# Patient Record
Sex: Male | Born: 1950 | Hispanic: Yes | Marital: Married | Smoking: Current every day smoker
Health system: Southern US, Community
[De-identification: ages and names within clinical notes are randomized; demographics above are authoritative.]

## PROBLEM LIST (undated history)

## (undated) DIAGNOSIS — H4902 Third [oculomotor] nerve palsy, left eye: Secondary | ICD-10-CM

## (undated) DIAGNOSIS — H532 Diplopia: Secondary | ICD-10-CM

## (undated) DIAGNOSIS — M75120 Complete rotator cuff tear or rupture of unspecified shoulder, not specified as traumatic: Secondary | ICD-10-CM

## (undated) DIAGNOSIS — R911 Solitary pulmonary nodule: Secondary | ICD-10-CM

## (undated) DIAGNOSIS — Z87442 Personal history of urinary calculi: Secondary | ICD-10-CM

## (undated) DIAGNOSIS — S52509A Unspecified fracture of the lower end of unspecified radius, initial encounter for closed fracture: Secondary | ICD-10-CM

## (undated) DIAGNOSIS — G9389 Other specified disorders of brain: Secondary | ICD-10-CM

## (undated) DIAGNOSIS — E785 Hyperlipidemia, unspecified: Secondary | ICD-10-CM

## (undated) DIAGNOSIS — I10 Essential (primary) hypertension: Secondary | ICD-10-CM

## (undated) HISTORY — PX: COLONOSCOPY: SHX174

## (undated) HISTORY — DX: Personal history of urinary calculi: Z87.442

## (undated) HISTORY — PX: OTHER SURGICAL HISTORY: SHX169

---

## 2008-07-03 ENCOUNTER — Ambulatory Visit: Payer: Self-pay | Admitting: Gastroenterology

## 2012-11-06 ENCOUNTER — Ambulatory Visit: Payer: Self-pay | Admitting: Urology

## 2012-11-11 ENCOUNTER — Ambulatory Visit: Payer: Self-pay | Admitting: Urology

## 2014-05-08 NOTE — Op Note (Signed)
PATIENT NAME:  Chase Martin, Chase Martin MR#:  [id number] DATE OF BIRTH:  03/09/1950  DATE OF PROCEDURE:  11/11/2012  PREOPERATIVE DIAGNOSES: 1.  Benign prostatic hypertrophy with bladder outlet obstruction.  2.  Bladder stones.   POSTOPERATIVE DIAGNOSES: 1.  Benign prostatic hypertrophy with bladder outlet obstruction.  2.  Bladder stones.   PROCEDURES: 1.  Photovaporization of the prostate with GreenLight laser.  2.  Litholapaxy of bladder stones with the holmium laser.   SURGEON: Anola GurneyMichael Murdis Flitton, M.D.   ANESTHETIST:  Dr. Darleene CleaverVan Staveren and surgeon.   ANESTHETIC METHOD: General per Dr. Darleene CleaverVan Staveren, local per Dr. Evelene CroonWolff.   INDICATIONS: See the dictated history and physical. After informed consent, the patient requests the above procedure.   OPERATIVE SUMMARY: After adequate general anesthesia had been obtained, the patient was placed into dorsal lithotomy position, and the perineum was prepped and draped in the usual fashion. The laser scope was coupled with the camera and then visually advanced into the bladder. The patient was noted to have trilobar BPH. He also had 15 bladder stones present measuring 7 to 10 mm each. At this point, the 990 micron holmium laser fiber was introduced through the scope, and the stones were fragmented. The stone fragments were evacuated from the bladder. At this point, the XPS GreenLight laser fiber was introduced through the scope, and vaporization of the prostate was begun at the bladder neck at a setting of 80 watts. Power was  then increased up to 120 watts for the middle part of the prostate, and finally up to 180 watts for any remaining obstructive tissue. Tissue was vaporized from the bladder neck to the verumontanum. At this point, the scope was removed and 10 mL of viscous Xylocaine instilled within the urethra. A 20-French silicone catheter was placed. The catheter was irrigated until clear. A B and O suppository was placed. The procedure was then terminated, and the  patient was transferred to the recovery room in stable condition.      ____________________________ Suszanne ConnersMichael R. Evelene CroonWolff, MD mrw:dmm D: 11/11/2012 13:42:24 ET T: 11/11/2012 20:57:21 ET JOB#: [id number]  cc: Suszanne ConnersMichael R. Evelene CroonWolff, MD, <Dictator> Orson ApeMICHAEL R Dereon Corkery MD ELECTRONICALLY SIGNED 11/12/2012 8:31

## 2014-05-08 NOTE — H&P (Signed)
PATIENT NAME:  Chase Martin, Chase MR#:  [id number] DATE OF BIRTH:  01-07-1951  DATE OF ADMISSION:  11/11/2012  CHIEF COMPLAINT: Bloody urine and difficulty voiding.   HISTORY OF PRESENT ILLNESS: Mr. Chase Martin is a 64 year old Hispanic male with a greater than 1 month history of intermittent hematuria and difficulty urinating. He was evaluated in the office with cystoscopy and found to have trilobar BPH and multiple bladder stones. Upper tract evaluation with IVP indicated normal kidneys but elevation of the bladder base by an enlarged prostate and moderate postvoid residual. AUA symptom score was 7 with a quality of life score of 2. PSA was 1.0 and creatinine was 0.78 on 09/24. The patient comes in now for photovaporization of the prostate with green light laser and litholapaxy of bladder stones with the holmium laser.   ALLERGIES: No drug allergies.   MEDICATIONS: No medications.   PAST SURGICAL HISTORY: No previous surgical procedures.   SOCIAL HISTORY: He denied alcohol use. He smokes only on rare occasions.   FAMILY HISTORY: Negative for kidney disease or prostate cancer.   PAST AND CURRENT MEDICAL CONDITIONS: Negative.   REVIEW OF SYSTEMS:  The patient denied heart disease, lung disease, diabetes, stroke, or hypertension.   PHYSICAL EXAMINATION: GENERAL: Well-nourished Hispanic male in no distress.  HEENT: Sclerae were clear. Pupils were equally round, reactive to light accommodation. Extraocular movements are intact.  NECK: Supple. No palpable masses or tenderness. Thyroid gland was smooth and nontender. No audible carotid bruits.  LUNGS: Clear to auscultation.  CARDIOVASCULAR: Regular rhythm and rate without audible murmurs.  ABDOMEN: Soft, nontender abdomen.  GENITOURINARY: Uncircumcised. Testes smooth and nontender, 18 mL in size each.  RECTAL: 30 grams smooth nontender prostate.  NEUROMUSCULAR: Alert and oriented x 3.   IMPRESSION: 1.  Benign prostatic hypertrophy with bladder  outlet obstruction.  2.  Bladder stones.   PLAN: Photovaporization of the prostate with green light laser and litholapaxy of the bladder stones with the holmium laser.     ____________________________ Suszanne ConnersMichael R. Evelene CroonWolff, MD mrw:dp D: 10/29/2012 13:01:00 ET T: 10/29/2012 13:11:59 ET JOB#: [id number]  cc: Suszanne ConnersMichael R. Evelene CroonWolff, MD, <Dictator> Orson ApeMICHAEL R WOLFF MD ELECTRONICALLY SIGNED 10/29/2012 14:21

## 2015-12-03 DIAGNOSIS — E785 Hyperlipidemia, unspecified: Secondary | ICD-10-CM | POA: Insufficient documentation

## 2015-12-03 DIAGNOSIS — I10 Essential (primary) hypertension: Secondary | ICD-10-CM | POA: Insufficient documentation

## 2015-12-03 DIAGNOSIS — N200 Calculus of kidney: Secondary | ICD-10-CM | POA: Insufficient documentation

## 2017-03-11 DIAGNOSIS — G9389 Other specified disorders of brain: Secondary | ICD-10-CM | POA: Insufficient documentation

## 2017-03-11 DIAGNOSIS — H538 Other visual disturbances: Secondary | ICD-10-CM | POA: Insufficient documentation

## 2017-03-11 DIAGNOSIS — H532 Diplopia: Secondary | ICD-10-CM | POA: Insufficient documentation

## 2017-03-13 DIAGNOSIS — H4902 Third [oculomotor] nerve palsy, left eye: Secondary | ICD-10-CM | POA: Insufficient documentation

## 2017-08-05 ENCOUNTER — Emergency Department
Admission: EM | Admit: 2017-08-05 | Discharge: 2017-08-05 | Disposition: A | Discharge status: Home / Self Care | Payer: BLUE CROSS/BLUE SHIELD | Attending: Emergency Medicine | Admitting: Emergency Medicine

## 2017-08-05 ENCOUNTER — Emergency Department: Payer: BLUE CROSS/BLUE SHIELD

## 2017-08-05 ENCOUNTER — Other Ambulatory Visit: Payer: Self-pay

## 2017-08-05 DIAGNOSIS — N200 Calculus of kidney: Secondary | ICD-10-CM

## 2017-08-05 DIAGNOSIS — Z87442 Personal history of urinary calculi: Secondary | ICD-10-CM | POA: Diagnosis not present

## 2017-08-05 DIAGNOSIS — R1084 Generalized abdominal pain: Secondary | ICD-10-CM | POA: Diagnosis not present

## 2017-08-05 DIAGNOSIS — F17210 Nicotine dependence, cigarettes, uncomplicated: Secondary | ICD-10-CM | POA: Diagnosis not present

## 2017-08-05 HISTORY — DX: Hyperlipidemia, unspecified: E78.5

## 2017-08-05 LAB — URINALYSIS, COMPLETE (UACMP) WITH MICROSCOPIC
BILIRUBIN URINE: NEGATIVE
Bacteria, UA: NONE SEEN
GLUCOSE, UA: NEGATIVE mg/dL
KETONES UR: NEGATIVE mg/dL
LEUKOCYTES UA: NEGATIVE
NITRITE: NEGATIVE
PH: 8 (ref 5.0–8.0)
Protein, ur: 30 mg/dL — AB
RBC / HPF: >50 RBC/hpf — ABNORMAL HIGH (ref 0–5)
SPECIFIC GRAVITY, URINE: 1.017 (ref 1.005–1.030)
SQUAMOUS EPITHELIAL / LPF: NONE SEEN (ref 0–5)

## 2017-08-05 LAB — COMPREHENSIVE METABOLIC PANEL
ALT: 25 U/L (ref 0–44)
AST: 32 U/L (ref 15–41)
Albumin: 4.2 g/dL (ref 3.5–5.0)
Alkaline Phosphatase: 58 U/L (ref 38–126)
Anion gap: 9 (ref 5–15)
BILIRUBIN TOTAL: 1 mg/dL (ref 0.3–1.2)
BUN: 12 mg/dL (ref 8–23)
CHLORIDE: 108 mmol/L (ref 98–111)
CO2: 24 mmol/L (ref 22–32)
CREATININE: 1.03 mg/dL (ref 0.61–1.24)
Calcium: 9.1 mg/dL (ref 8.9–10.3)
GFR calc Af Amer: >60 mL/min (ref >60)
Glucose, Bld: 129 mg/dL — ABNORMAL HIGH (ref 70–99)
POTASSIUM: 3.8 mmol/L (ref 3.5–5.1)
Sodium: 141 mmol/L (ref 135–145)
TOTAL PROTEIN: 7 g/dL (ref 6.5–8.1)

## 2017-08-05 LAB — CBC
HCT: 40.6 % (ref 40.0–52.0)
Hemoglobin: 14.5 g/dL (ref 13.0–18.0)
MCH: 32.8 pg (ref 26.0–34.0)
MCHC: 35.7 g/dL (ref 32.0–36.0)
MCV: 92 fL (ref 80.0–100.0)
PLATELETS: 212 10*3/uL (ref 150–440)
RBC: 4.42 MIL/uL (ref 4.40–5.90)
RDW: 13 % (ref 11.5–14.5)
WBC: 9.2 10*3/uL (ref 3.8–10.6)

## 2017-08-05 LAB — LIPASE, BLOOD: LIPASE: 39 U/L (ref 11–51)

## 2017-08-05 MED ORDER — IBUPROFEN 200 MG PO TABS: 600.0000 mg | ORAL_TABLET | Freq: Four times a day (QID) | ORAL | 0 refills | Status: AC | PRN

## 2017-08-05 MED ORDER — KETOROLAC TROMETHAMINE 30 MG/ML IJ SOLN
15.0000 mg | Freq: Once | INTRAMUSCULAR | Status: AC
  Administered 2017-08-05: 15 mg via INTRAVENOUS

## 2017-08-05 MED ORDER — ONDANSETRON HCL 4 MG PO TABS: 4.0000 mg | ORAL_TABLET | Freq: Three times a day (TID) | ORAL | 0 refills | Status: AC | PRN

## 2017-08-05 MED ORDER — MORPHINE SULFATE (PF) 4 MG/ML IV SOLN
4.0000 mg | Freq: Once | INTRAVENOUS | Status: AC
  Administered 2017-08-05: 4 mg via INTRAVENOUS
  Filled 2017-08-05: qty 1

## 2017-08-05 MED ORDER — ONDANSETRON HCL 4 MG/2ML IJ SOLN
4.0000 mg | Freq: Once | INTRAMUSCULAR | Status: AC
  Administered 2017-08-05: 4 mg via INTRAVENOUS
  Filled 2017-08-05: qty 2

## 2017-08-05 MED ORDER — SODIUM CHLORIDE 0.9 % IV BOLUS
1000.0000 mL | Freq: Once | INTRAVENOUS | Status: AC
  Administered 2017-08-05: 1000 mL via INTRAVENOUS

## 2017-08-05 MED ORDER — OXYCODONE-ACETAMINOPHEN 5-325 MG PO TABS: 1.0000 | ORAL_TABLET | ORAL | 0 refills | Status: AC | PRN

## 2017-08-05 MED ORDER — KETOROLAC TROMETHAMINE 30 MG/ML IJ SOLN
INTRAMUSCULAR | Status: AC
  Filled 2017-08-05: qty 1

## 2017-08-05 MED ORDER — TAMSULOSIN HCL 0.4 MG PO CAPS: 0.4000 mg | ORAL_CAPSULE | Freq: Every day | ORAL | 0 refills | Status: AC

## 2017-08-05 NOTE — ED Notes (Signed)
Upon arrival to the room, patient was seen on his knees leaning against the bed. Patient appeared uncomfortable. Patient was assisted to the stretcher.

## 2017-08-05 NOTE — ED Triage Notes (Signed)
Pt arrived via POV with reports of LLQ abd pain that started around 0700.  Pt guarding left side and appears to be uncomfortable.

## 2017-08-05 NOTE — ED Notes (Signed)
Per Liane Combere Mcshane given pt po fluid challenge  And  Crackers

## 2017-08-05 NOTE — ED Provider Notes (Signed)
[facility] Emergency Department Provider Note  ____________________________________________   I have reviewed the triage vital signs and the nursing notes. Where available I have reviewed prior notes and, if possible and indicated, outside hospital notes.    HISTORY  Chief Complaint Abdominal Pain    HPI Chase Martin is a 67 y.o. male  Today complaining of sudden onset left-sided abdominal/flank pain, started this morning, no fever no chills, had some vomiting when it hurt a lot, history of kidney stones once 8 years ago, very similar to that.  Does have a history of hypertension, no other medical problems or allergies, no antecedent symptoms, has not tried anything for the pain.  States that he has had no dysuria no urinary frequency, no fevers no chills, no hematuria, no other alleviating or aggravating symptoms.  *   Past Medical History:  Diagnosis Date  . Hyperlipemia     There are no active problems to display for this patient.   History reviewed. No pertinent surgical history.  Prior to Admission medications   Not on File    Allergies Patient has no known allergies.  No family history on file.  Social History Social History   Tobacco Use  . Smoking status: Current Every Day Smoker  . Smokeless tobacco: Never Used  Substance Use Topics  . Alcohol use: Yes  . Drug use: Not on file    Review of Systems Constitutional: No fever/chills Eyes: No visual changes. ENT: No sore throat. No stiff neck no neck pain Cardiovascular: Denies chest pain. Respiratory: Denies shortness of breath. Gastrointestinal:   no vomiting.  No diarrhea.  No constipation. Genitourinary: Negative for dysuria. Musculoskeletal: Negative lower extremity swelling Skin: Negative for rash. Neurological: Negative for severe headaches, focal weakness or numbness.   ____________________________________________   PHYSICAL EXAM:  VITAL SIGNS: ED Triage  Vitals  Enc Vitals Group     BP 08/05/17 1026 (!) 173/98     Pulse Rate 08/05/17 1026 (!) 55     Resp 08/05/17 1026 (!) 22     Temp 08/05/17 1026 97.9 F (36.6 C)     Temp Source 08/05/17 1026 Oral     SpO2 08/05/17 1026 94 %     Weight 08/05/17 1027 180 lb (81.6 kg)     Height 08/05/17 1027 5\' 4"  (1.626 m)     Head Circumference --      Peak Flow --      Pain Score 08/05/17 1036 5     Pain Loc --      Pain Edu? --      Excl. in [place]? --     Constitutional: Alert and oriented. Well appearing and in no acute distress. Eyes: Conjunctivae are normal Head: Atraumatic HEENT: No congestion/rhinnorhea. Mucous membranes are moist.  Oropharynx non-erythematous Neck:   Nontender with no meningismus, no masses, no stridor Cardiovascular: Normal rate, regular rhythm. Grossly normal heart sounds.  Good peripheral circulation. Respiratory: Normal respiratory effort.  No retractions. Lungs CTAB. Abdominal: Soft and minimal left-sided tenderness. No distention. No guarding no rebound Back:  There is no focal tenderness or step off.  there is no midline tenderness there are no lesions noted. there is positive left CVA tenderness : No testicular masses or swelling, normal GU Musculoskeletal: No lower extremity tenderness, no upper extremity tenderness. No joint effusions, no DVT signs strong distal pulses no edema Neurologic:  Normal speech and language. No gross focal neurologic deficits are appreciated.  Skin:  Skin is warm,  dry and intact. No rash noted. Psychiatric: Mood and affect are normal. Speech and behavior are normal.  ____________________________________________   LABS (all labs ordered are listed, but only abnormal results are displayed)  Labs Reviewed  CBC  LIPASE, BLOOD  COMPREHENSIVE METABOLIC PANEL  URINALYSIS, COMPLETE (UACMP) WITH MICROSCOPIC    Pertinent labs  results that were available during my care of the patient were reviewed by me and considered in my medical  decision making (see chart for details). ____________________________________________  EKG  I personally interpreted any EKGs ordered by me or triage  ____________________________________________  RADIOLOGY  Pertinent labs & imaging results that were available during my care of the patient were reviewed by me and considered in my medical decision making (see chart for details). If possible, patient and/or family made aware of any abnormal findings.  No results found. ____________________________________________    PROCEDURES  Procedure(s) performed: None  Procedures  Critical Care performed: None  ____________________________________________   INITIAL IMPRESSION / ASSESSMENT AND PLAN / ED COURSE  Pertinent labs & imaging results that were available during my care of the patient were reviewed by me and considered in my medical decision making (see chart for details).  Patient here with sudden onset left-sided flank pain consistent with likely prior kidney stones, however, AAA is in the differential, we have sent patient emergently to CT scan for further evaluation, we will treat his pain, urinalysis is in process and CBC is reassuring abdomen is nonsurgical, blood pressure is elevated but likely secondary to baseline hypertension and pain, we will reassess.    ____________________________________________   FINAL CLINICAL IMPRESSION(S) / ED DIAGNOSES  Final diagnoses:  None      This chart was dictated using voice recognition software.  Despite best efforts to proofread,  errors can occur which can change meaning.      Jeanmarie PlantMcShane, Malala Trenkamp A, MD 08/05/17 [phone]

## 2017-08-05 NOTE — ED Notes (Signed)
Patient  In ct scan

## 2017-08-05 NOTE — ED Notes (Signed)
Patient tolerated PO challenge well.  

## 2017-08-22 NOTE — Progress Notes (Signed)
08/24/2017 10:18 AM   Chase Martin 12-18-1950 [id number]  Referring provider: Marguarite ArbourSparks, Jeffrey D, MD [address] [facility] [place], [place] [id number]  CC: Recently passed kidney stone  HPI: Mr. Chase Martin is a 67 year old Spanish-speaking male who presents for ED follow-up of a 3 mm left mid ureteral stone.  He did pass the stone and he brings this with him to clinic today and will be sent off for stone analysis.  He denies any flank pain fever, chills, or urinary symptoms including dysuria urgency or frequency.  He does have a long history of kidney stones that have spontaneously passed, as well as required a ureteroscopy with Dr. Sheppard PentonWolf in 2014.  Additionally he has a strong family history of stones in his father.  He reports he works in a very hot environment but does try to stay hydrated.  He denies excessive salt intake.  He denies any prior work-up including a 24-hour urine.   PMH: Past Medical History:  Diagnosis Date  . Hyperlipemia     Surgical History: Ureteroscopy with Dr. Evelene CroonWolff 2014  Home Medications:  Allergies as of 08/24/2017   No Known Allergies     Medication List        Accurate as of 08/22/17 10:18 AM. Always use your most recent med list.          ibuprofen 200 MG tablet Commonly known as:  MOTRIN IB Take 3 tablets (600 mg total) by mouth every 6 (six) hours as needed.   ondansetron 4 MG tablet Commonly known as:  ZOFRAN Take 1 tablet (4 mg total) by mouth every 8 (eight) hours as needed for nausea or vomiting.   tamsulosin 0.4 MG Caps capsule Commonly known as:  FLOMAX Take 1 capsule (0.4 mg total) by mouth daily.       Allergies: No Known Allergies  Family History: No family history on file.  Social History:  reports that he has been smoking.  He has never used smokeless tobacco. He reports that he drinks alcohol. His drug history is not on file.  ROS: Please see ROS flowsheet dated today.  Physical Exam: There were no  vitals taken for this visit.  Constitutional:  Alert and oriented, No acute distress. HEENT: [place] AT, moist mucus membranes.  Trachea midline, no masses. Cardiovascular: No clubbing, cyanosis, or edema. Respiratory: Normal respiratory effort, no increased work of breathing. GI: Abdomen is soft, nontender, nondistended, no abdominal masses GU: No CVA tenderness.  Skin: No rashes, bruises or suspicious lesions. Neurologic: Grossly intact, no focal deficits, moving all 4 extremities. Psychiatric: Normal mood and affect.  Laboratory Data: Lab Results  Component Value Date   WBC 9.2 08/05/2017   HGB 14.5 08/05/2017   HCT 40.6 08/05/2017   MCV 92.0 08/05/2017   PLT 212 08/05/2017   Calcium: 9.1  Lab Results  Component Value Date   CREATININE 1.03 08/05/2017    No results found for: PSA  No results found for: TESTOSTERONE  No results found for: HGBA1C  Urinalysis    Component Value Date/Time   COLORURINE YELLOW (A) 08/05/2017 1042   APPEARANCEUR CLEAR (A) 08/05/2017 1042   LABSPEC 1.017 08/05/2017 1042   PHURINE 8.0 08/05/2017 1042   GLUCOSEU NEGATIVE 08/05/2017 1042   HGBUR LARGE (A) 08/05/2017 1042   BILIRUBINUR NEGATIVE 08/05/2017 1042   KETONESUR NEGATIVE 08/05/2017 1042   PROTEINUR 30 (A) 08/05/2017 1042   NITRITE NEGATIVE 08/05/2017 1042   LEUKOCYTESUR NEGATIVE 08/05/2017 1042    Pertinent  Imaging: CT 7/21  Personally reviewed with patient. Left ureteral stone has since passed, residual small stones in left upper pole. Right 2.2cm renal cyst.  Assessment and Plan:  In summary Mr. [name] is an otherwise healthy 67 year old male with a strong family history of kidney stones as well as recurrent episodes of urolithiasis that have spontaneously passed. He did require ureteroscopy in 2014 with Dr. Sheppard Penton.  He recently passed a left-sided 3 mm stone which he brings to clinic today.  He denies any ongoing symptoms or flank pain.  His strong family history and recurrent  urolithiasis we did discuss further work-up with a 24-hour urine collection which he is agreeable to. On review of his CT scan he has very minimal stone burden with 2 small stones in the left upper pole.  Plan  1.  24-hour urine collection and follow-up with results in 6 to 8 weeks  2. We discussed general prevention center strategies including aggressive hydration with urine output goal of 2.5 L/day, minimizing salt, and minimizing red meat in the diet.   3. Follow-up as stone analysis     Sondra Come, MD  [facility] [address] [place], [place] [address] [phone]

## 2017-08-24 ENCOUNTER — Encounter: Payer: Self-pay | Admitting: Urology

## 2017-08-24 ENCOUNTER — Ambulatory Visit: Payer: BLUE CROSS/BLUE SHIELD | Admitting: Urology

## 2017-08-24 VITALS — BP 138/85 | HR 56 | Ht 64.0 in | Wt 179.4 lb

## 2017-08-24 DIAGNOSIS — N202 Calculus of kidney with calculus of ureter: Secondary | ICD-10-CM

## 2017-08-24 NOTE — Patient Instructions (Signed)
Follow up in ~2 months with 24 urine results

## 2017-09-21 ENCOUNTER — Other Ambulatory Visit: Payer: Self-pay | Admitting: Urology

## 2017-10-17 ENCOUNTER — Encounter: Payer: Self-pay | Admitting: Urology

## 2017-10-17 ENCOUNTER — Ambulatory Visit: Payer: BLUE CROSS/BLUE SHIELD | Admitting: Urology

## 2018-06-27 ENCOUNTER — Emergency Department
Admission: EM | Admit: 2018-06-27 | Discharge: 2018-06-28 | Disposition: A | Discharge status: Home / Self Care | Payer: BC Managed Care – PPO | Attending: Emergency Medicine | Admitting: Emergency Medicine

## 2018-06-27 ENCOUNTER — Other Ambulatory Visit: Payer: Self-pay

## 2018-06-27 DIAGNOSIS — Z79899 Other long term (current) drug therapy: Secondary | ICD-10-CM | POA: Diagnosis not present

## 2018-06-27 DIAGNOSIS — G4700 Insomnia, unspecified: Secondary | ICD-10-CM | POA: Diagnosis not present

## 2018-06-27 DIAGNOSIS — U071 COVID-19: Secondary | ICD-10-CM | POA: Diagnosis not present

## 2018-06-27 DIAGNOSIS — F17210 Nicotine dependence, cigarettes, uncomplicated: Secondary | ICD-10-CM | POA: Insufficient documentation

## 2018-06-27 DIAGNOSIS — Z7982 Long term (current) use of aspirin: Secondary | ICD-10-CM | POA: Diagnosis not present

## 2018-06-27 DIAGNOSIS — I10 Essential (primary) hypertension: Secondary | ICD-10-CM | POA: Diagnosis not present

## 2018-06-27 DIAGNOSIS — R509 Fever, unspecified: Secondary | ICD-10-CM | POA: Diagnosis present

## 2018-06-27 NOTE — ED Triage Notes (Signed)
Patient c/o fever and insomnia. Patient reports he is COVID positive.

## 2018-06-28 MED ORDER — IBUPROFEN 400 MG PO TABS
600.0000 mg | ORAL_TABLET | Freq: Once | ORAL | Status: AC
  Administered 2018-06-28: 600 mg via ORAL
  Filled 2018-06-28: qty 2

## 2018-06-28 MED ORDER — TRAZODONE HCL 100 MG PO TABS
100.0000 mg | ORAL_TABLET | Freq: Every day | ORAL | Status: DC
  Administered 2018-06-28: 01:00:00 100 mg via ORAL
  Filled 2018-06-28 (×2): qty 1

## 2018-06-28 MED ORDER — ACETAMINOPHEN 500 MG PO TABS
1000.0000 mg | ORAL_TABLET | Freq: Once | ORAL | Status: AC
  Administered 2018-06-28: 02:00:00 1000 mg via ORAL
  Filled 2018-06-28: qty 2

## 2018-06-28 NOTE — ED Notes (Signed)
Pt resting in bed. Denies any needs currently.

## 2018-06-28 NOTE — ED Provider Notes (Signed)
[facility] Emergency Department Provider Note ___   First MD Initiated Contact with Patient 06/28/18 0044     (approximate)  I have reviewed the triage vital signs and the nursing notes.   HISTORY  Chief Complaint Fever    HPI Chase Martin is a 68 y.o. male with below list of previous medical conditions including recently diagnosed COVID-19 presents to the emergency department secondary to "I cannot sleep".  Patient states that he has had difficulty sleeping over the past 2 weeks.  Patient states that he has continued to have fevers at home however controlled with Tylenol.  Patient denies any difficulty breathing no chest pain no dizziness no nausea no vomiting no abdominal pain       Past Medical History:  Diagnosis Date  . History of kidney stones   . Hyperlipemia     Patient Active Problem List   Diagnosis Date Noted  . 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 Blurry vision 03/11/2017  . 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 Diplopia 03/11/2017  . HTN, goal below 140/80 12/03/2015  . Hyperlipidemia 12/03/2015  . Kidney stones 12/03/2015    History reviewed. No pertinent surgical history.  Prior to Admission medications   Medication Sig Start Date End Date Taking? Authorizing Provider  ASPIRIN LOW DOSE 81 MG EC tablet TK 1 T PO ONCE D 07/23/17   [provider]  atorvastatin (LIPITOR) 10 MG tablet TK 1 T PO ONCE D 07/23/17   [provider]  ibuprofen (MOTRIN IB) 200 MG tablet Take 3 tablets (600 mg total) by mouth every 6 (six) hours as needed. 08/05/17   Jeanmarie PlantMcShane, James A, MD  ondansetron (ZOFRAN) 4 MG tablet Take 1 tablet (4 mg total) by mouth every 8 (eight) hours as needed for nausea or vomiting. 08/05/17   Jeanmarie PlantMcShane, James A, MD  tamsulosin (FLOMAX) 0.4 MG CAPS capsule Take 1 capsule (0.4 mg total) by mouth daily. 08/05/17   Jeanmarie PlantMcShane, James A, MD    Allergies Patient has no known allergies.  No family history  on file.  Social History Social History   Tobacco Use  . Smoking status: Current Every Day Smoker    Years: 10.00    Types: Cigarettes  . Smokeless tobacco: Never Used  . Tobacco comment: 2-3 per week   Substance Use Topics  . Alcohol use: Yes  . Drug use: Not Currently    Review of Systems Constitutional: No fever/chills Eyes: No visual changes. ENT: No sore throat. Cardiovascular: Denies chest pain. Respiratory: Denies shortness of breath. Gastrointestinal: No abdominal pain.  No nausea, no vomiting.  No diarrhea.  No constipation. Genitourinary: Negative for dysuria. Musculoskeletal: Negative for neck pain.  Negative for back pain. Integumentary: Negative for rash. Neurological: Negative for headaches, focal weakness or numbness. Psychiatric:  Positive for insomnia   ____________________________________________   PHYSICAL EXAM:  VITAL SIGNS: ED Triage Vitals  Enc Vitals Group     BP 06/27/18 2240 124/83     Pulse Rate 06/27/18 2240 67     Resp 06/27/18 2240 18     Temp 06/27/18 2240 (!) 100.5 F (38.1 C)     Temp Source 06/27/18 2240 Oral     SpO2 06/27/18 2240 96 %     Weight 06/27/18 2238 80.3 kg (177 lb)     Height 06/27/18 2238 1.575 m (5\' 2" )     Head Circumference --      Peak Flow --  Pain Score 06/27/18 2238 3     Pain Loc --      Pain Edu? --      Excl. in [place]? --     Constitutional: Alert and oriented. Well appearing and in no acute distress. Eyes: Conjunctivae are normal.  Mouth/Throat: Mucous membranes are moist. Oropharynx non-erythematous. Neck: No stridor.   Cardiovascular: Normal rate, regular rhythm. Good peripheral circulation. Grossly normal heart sounds. Respiratory: Normal respiratory effort.  No retractions. No audible wheezing. Gastrointestinal: Soft and nontender. No distention.  Musculoskeletal: No lower extremity tenderness nor edema. No gross deformities of extremities. Neurologic:  Normal speech and language. No gross  focal neurologic deficits are appreciated.  Skin:  Skin is warm, dry and intact. No rash noted. Psychiatric: Mood and affect are normal. Speech and behavior are normal.    Procedures   ____________________________________________   INITIAL IMPRESSION / MDM / ASSESSMENT AND PLAN / ED COURSE  As part of my medical decision making, I reviewed the following data within the electronic MEDICAL RECORD NUMBER  68 year old male presented with above-stated history and physical exam secondary to insomnia.  Patient denies any respiratory difficulty clinical exam unremarkable.  Patient given trazodone in the emergency department with recommendation to follow-up with Dr. Doy Hutching his primary care physician today  *Chase Martin was evaluated in Emergency Department on 06/28/2018 for the symptoms described in the history of present illness. He was evaluated in the context of the global COVID-19 pandemic, which necessitated consideration that the patient might be at risk for infection with the SARS-CoV-2 virus that causes COVID-19. Institutional protocols and algorithms that pertain to the evaluation of patients at risk for COVID-19 are in a state of rapid change based on information released by regulatory bodies including the CDC and federal and state organizations. These policies and algorithms were followed during the patient's care in the ED.  Some ED evaluations and interventions may be delayed as a result of limited staffing during the pandemic.*  ____________________________________________  FINAL CLINICAL IMPRESSION(S) / ED DIAGNOSES  Final diagnoses:  COVID-19  Insomnia, unspecified type     MEDICATIONS GIVEN DURING THIS VISIT:  Medications  traZODone (DESYREL) tablet 100 mg (100 mg Oral Given 06/28/18 0112)  ibuprofen (ADVIL) tablet 600 mg (600 mg Oral Given 06/28/18 0111)     ED Discharge Orders    None       Note:  This document was prepared using Dragon voice recognition software and  may include unintentional dictation errors.   Gregor Hams, MD 06/28/18 [phone]

## 2018-06-28 NOTE — ED Notes (Signed)
Pt wheeled out to lobby.  

## 2018-06-28 NOTE — ED Notes (Signed)
Lights dimmed for pt. Rail up. Bed locked low.

## 2019-03-01 ENCOUNTER — Ambulatory Visit: Payer: Self-pay | Attending: Internal Medicine

## 2019-03-01 DIAGNOSIS — Z23 Encounter for immunization: Secondary | ICD-10-CM | POA: Insufficient documentation

## 2019-03-01 NOTE — Progress Notes (Signed)
   Covid-19 Vaccination Clinic  Name:  Chase Martin    MRN: [id number] DOB: 1950-04-24  03/01/2019  Mr. Derusha was observed post Covid-19 immunization for 15 minutes without incidence. He was provided with Vaccine Information Sheet and instruction to access the V-Safe system.   Mr. Mastrangelo was instructed to call 911 with any severe reactions post vaccine: Marland Kitchen Difficulty breathing  . Swelling of your face and throat  . A fast heartbeat  . A bad rash all over your body  . Dizziness and weakness    Immunizations Administered    Name Date Dose VIS Date Route   Pfizer COVID-19 Vaccine 03/01/2019  9:54 AM 0.3 mL 12/27/2018 Intramuscular   Manufacturer: [employer], [employer]   Lot: [id number]   NDC: [address]-3

## 2019-03-22 ENCOUNTER — Other Ambulatory Visit: Payer: Self-pay

## 2019-03-22 ENCOUNTER — Ambulatory Visit: Payer: Self-pay | Attending: Internal Medicine

## 2019-03-22 DIAGNOSIS — Z23 Encounter for immunization: Secondary | ICD-10-CM | POA: Insufficient documentation

## 2019-03-22 NOTE — Progress Notes (Signed)
   Covid-19 Vaccination Clinic  Name:  Ludwin Flahive    MRN: [id number] DOB: 23-Feb-1950  03/22/2019  Mr. Attig was observed post Covid-19 immunization for 15 minutes without incident. He was provided with Vaccine Information Sheet and instruction to access the V-Safe system.   Mr. Koppel was instructed to call 911 with any severe reactions post vaccine: Marland Kitchen Difficulty breathing  . Swelling of face and throat  . A fast heartbeat  . A bad rash all over body  . Dizziness and weakness   Immunizations Administered    Name Date Dose VIS Date Route   Pfizer COVID-19 Vaccine 03/22/2019  9:47 AM 0.3 mL 12/27/2018 Intramuscular   Manufacturer: [employer], [employer]   Lot: [id number]   NDC: [address]-9

## 2019-11-11 ENCOUNTER — Other Ambulatory Visit: Payer: Self-pay | Admitting: Ophthalmology

## 2019-11-11 DIAGNOSIS — G453 Amaurosis fugax: Secondary | ICD-10-CM

## 2019-11-18 ENCOUNTER — Other Ambulatory Visit: Payer: Self-pay

## 2019-11-18 ENCOUNTER — Ambulatory Visit
Admission: RE | Admit: 2019-11-18 | Discharge: 2019-11-18 | Disposition: A | Discharge status: Home / Self Care | Payer: BC Managed Care – PPO | Source: Ambulatory Visit | Attending: Ophthalmology | Admitting: Ophthalmology

## 2019-11-18 DIAGNOSIS — G453 Amaurosis fugax: Secondary | ICD-10-CM | POA: Insufficient documentation

## 2020-07-05 IMAGING — CT CT RENAL STONE PROTOCOL
2 of 4 series · 16 of 46 positions shown, 18 images · non-contrast
Comparison: None.

CLINICAL DATA: Acute left flank abdominal pain, history of
nephrolithiasis

EXAM:
CT ABDOMEN AND PELVIS WITHOUT CONTRAST
TECHNIQUE: Multidetector CT imaging of the abdomen and pelvis was performed
following the standard protocol without IV contrast.

[Series 2: stone full standard · axial · 0.83mm/px · z∈[-484,-64]mm · 13 of 92 slices shown, 15 images]
[im 4/92  soft-tissue]
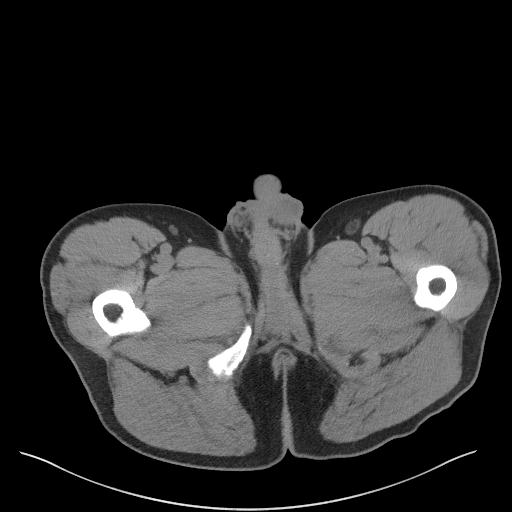
[im 4/92  bone]
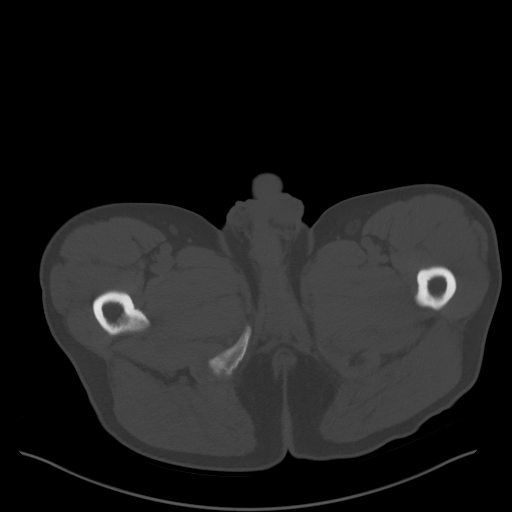
[im 11/92  soft-tissue]
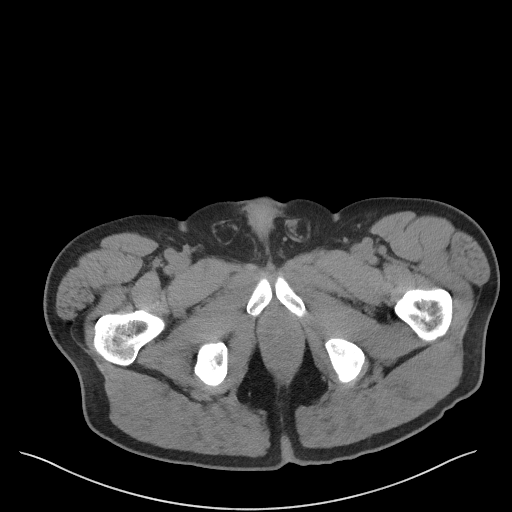
[im 19/92  soft-tissue]
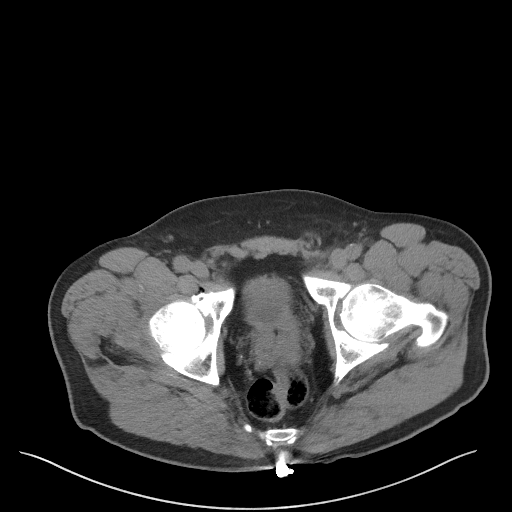
[im 26/92  soft-tissue]
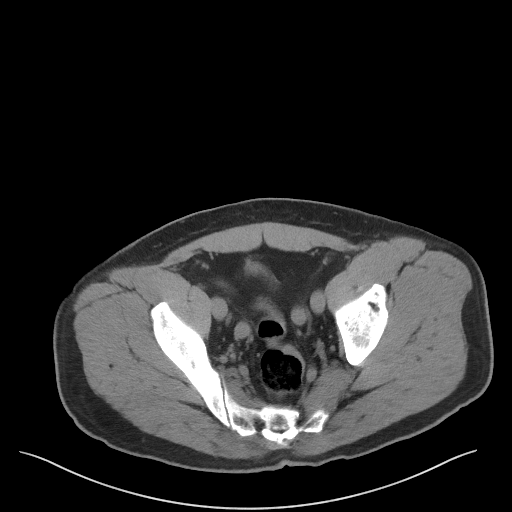
[im 33/92  soft-tissue]
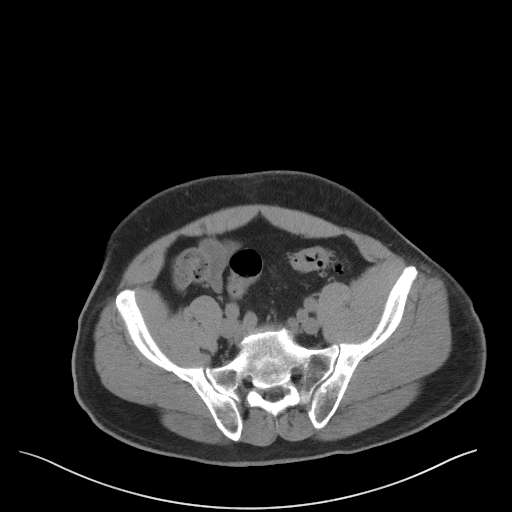
[im 41/92  soft-tissue]
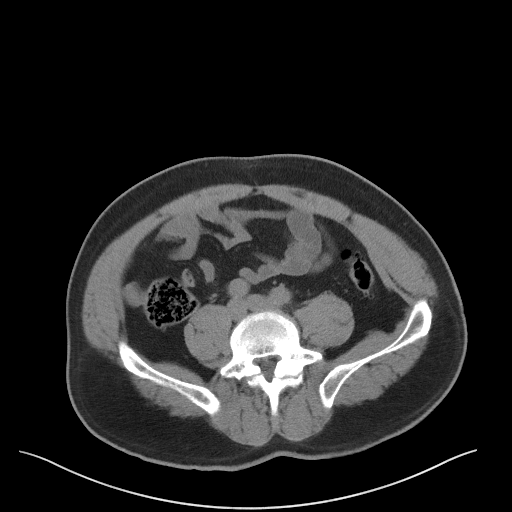
[im 48/92  soft-tissue]
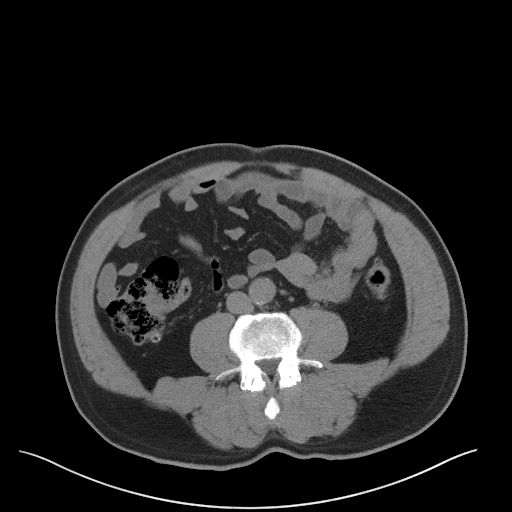
[im 51/92  soft-tissue]
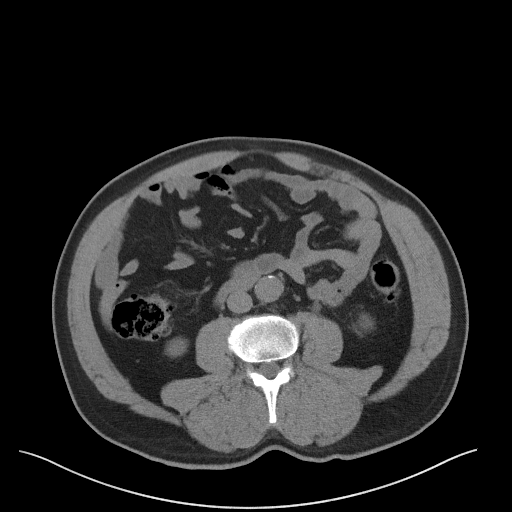
[im 59/92  soft-tissue]
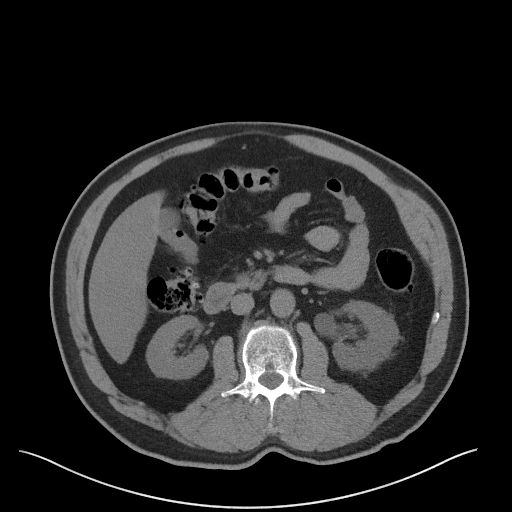
[im 59/92  bone]
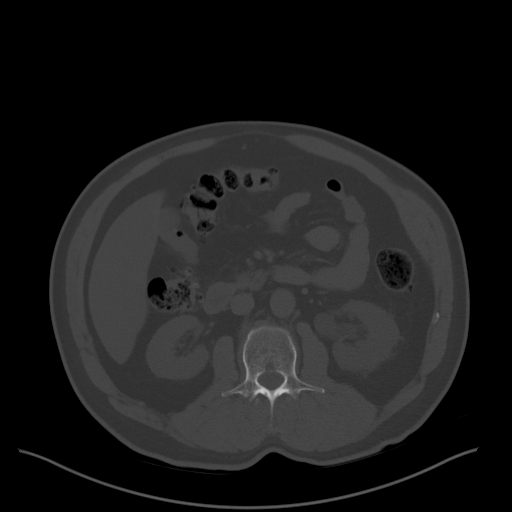
[im 66/92  soft-tissue]
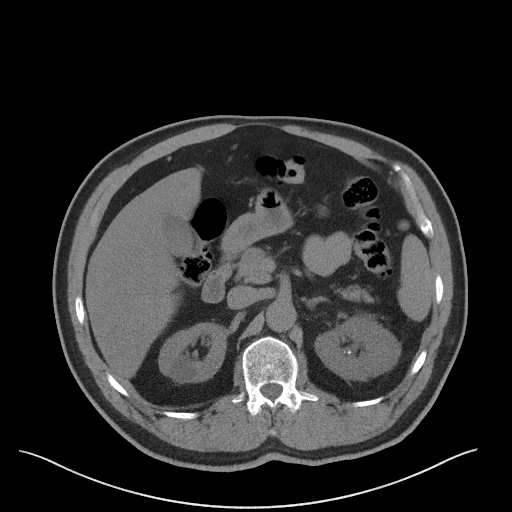
[im 73/92  soft-tissue]
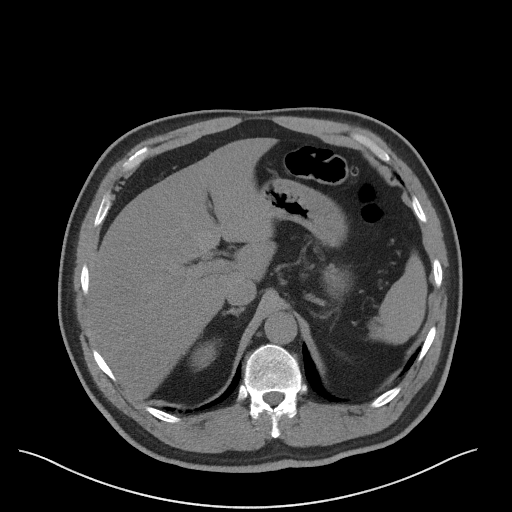
[im 81/92  soft-tissue]
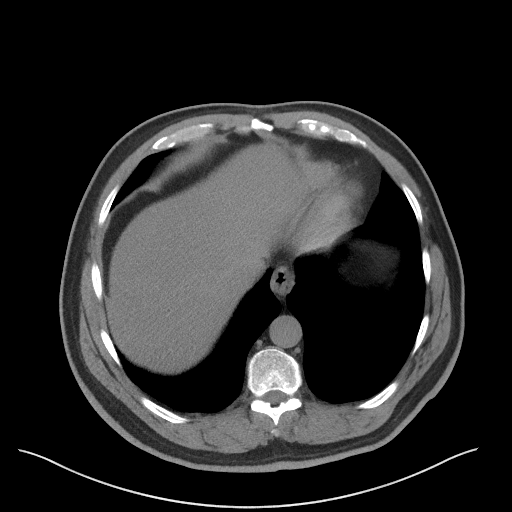
[im 88/92  soft-tissue]
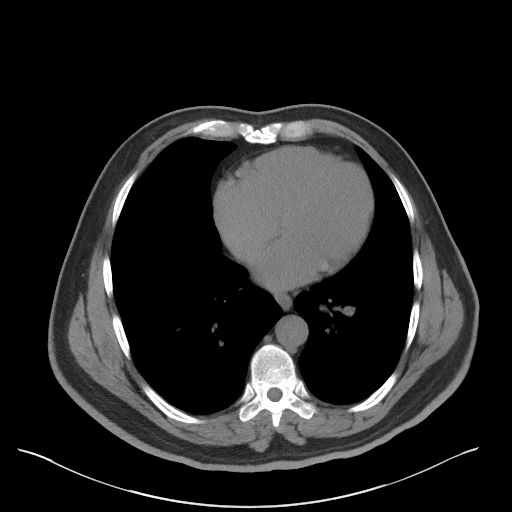

[Series 5: coronal · coronal · 0.75mm/px · 3 of 150 slices shown]
[im 50/150  soft-tissue]
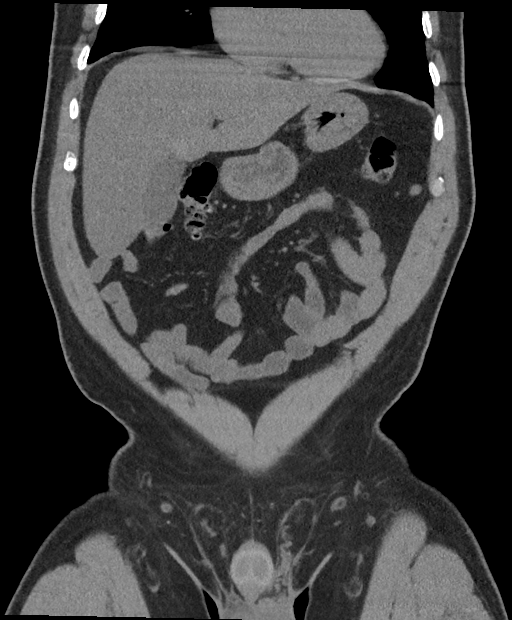
[im 67/150  soft-tissue]
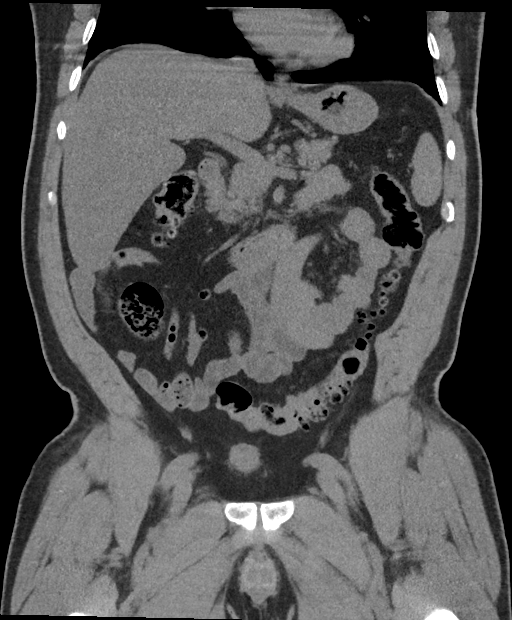
[im 83/150  soft-tissue]
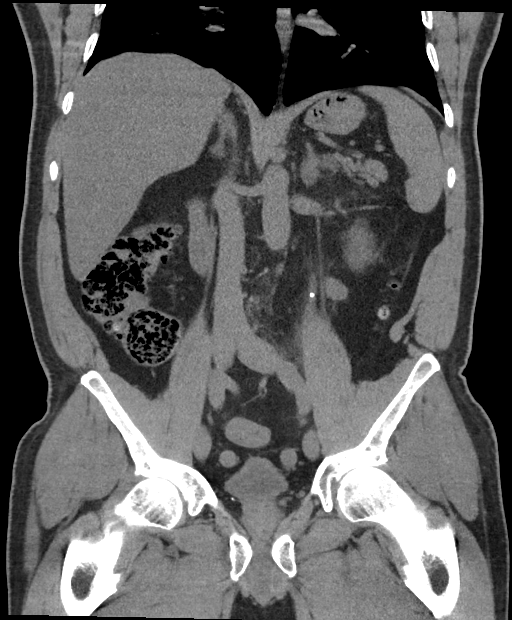

[16 of 46 positions shown; findings below may reference images not displayed]

FINDINGS: Lower chest: No acute abnormality.

Hepatobiliary: Diffuse hypoattenuation of the liver compatible with
hepatic steatosis. Fatty sparing along the gallbladder fossa.
Gallbladder biliary system nondilated. Liver is also mildly enlarged
measuring 17.6 cm in length.

Pancreas: Unremarkable. No pancreatic ductal dilatation or
surrounding inflammatory changes.

Spleen: Normal in size without focal abnormality. Small accessory
splenules noted.

Adrenals/Urinary Tract: Normal adrenal glands.

Left kidney demonstrates perinephric inflammatory stranding, and
mild left hydroureteronephrosis secondary to obstructing proximal
left ureteral calculus measuring 3 mm, image 44 series 2. There are
additional punctate nonobstructing left intrarenal calculi in the
upper mid pole regions.

Right kidney and ureter demonstrate no acute process or obstruction.
Hypodense right posterior midpole cortical lesion noted, suspect
renal cyst measuring 2.2 cm, image 28 series 2.

Bladder is collapsed.

Stomach/Bowel: Negative bowel obstruction, significant dilatation,
ileus, or free air. Appendix unremarkable. Scattered colonic
diverticulosis. No acute inflammatory process. No fluid collection
or abscess.

Vascular/Lymphatic: Aortic atherosclerosis without aneurysm. Mild
aortic tortuosity. No adenopathy.

Reproductive: Prostate is unremarkable.

Other: No abdominal wall hernia or abnormality. No abdominopelvic
ascites.

Musculoskeletal: Degenerative changes of the spine. Bilateral L5
pars defects. No acute osseous finding.
IMPRESSION: Mildly obstructing 3 mm proximal left ureteral calculus with mild
hydroureteronephrosis.

Additional punctate nonobstructing left intrarenal calculi

Hepatic steatosis and hepatomegaly

Colonic diverticulosis without acute inflammatory process

Atherosclerosis without aneurysm

## 2021-05-23 ENCOUNTER — Other Ambulatory Visit: Payer: Self-pay | Admitting: Internal Medicine

## 2021-05-23 DIAGNOSIS — R911 Solitary pulmonary nodule: Secondary | ICD-10-CM

## 2021-05-31 ENCOUNTER — Ambulatory Visit
Admission: RE | Admit: 2021-05-31 | Discharge: 2021-05-31 | Disposition: A | Discharge status: Home / Self Care | Payer: BC Managed Care – PPO | Source: Ambulatory Visit | Attending: Internal Medicine | Admitting: Internal Medicine

## 2021-05-31 DIAGNOSIS — R911 Solitary pulmonary nodule: Secondary | ICD-10-CM | POA: Insufficient documentation

## 2021-05-31 MED ORDER — IOHEXOL 300 MG/ML  SOLN
75.0000 mL | Freq: Once | INTRAMUSCULAR | Status: AC | PRN
  Administered 2021-05-31: 75 mL via INTRAVENOUS

## 2021-09-16 ENCOUNTER — Other Ambulatory Visit: Payer: Self-pay | Admitting: Pulmonary Disease

## 2021-09-16 DIAGNOSIS — R911 Solitary pulmonary nodule: Secondary | ICD-10-CM

## 2021-10-04 ENCOUNTER — Other Ambulatory Visit: Payer: BC Managed Care – PPO

## 2022-05-29 ENCOUNTER — Other Ambulatory Visit: Payer: Self-pay | Admitting: Internal Medicine

## 2022-05-29 DIAGNOSIS — R911 Solitary pulmonary nodule: Secondary | ICD-10-CM

## 2022-06-30 ENCOUNTER — Ambulatory Visit
Admission: RE | Admit: 2022-06-30 | Discharge: 2022-06-30 | Disposition: A | Discharge status: Home / Self Care | Payer: Medicare Other | Source: Ambulatory Visit | Attending: Internal Medicine | Admitting: Internal Medicine

## 2022-06-30 DIAGNOSIS — R911 Solitary pulmonary nodule: Secondary | ICD-10-CM

## 2023-02-15 ENCOUNTER — Other Ambulatory Visit: Payer: Self-pay | Admitting: Pulmonary Disease

## 2023-02-15 DIAGNOSIS — J849 Interstitial pulmonary disease, unspecified: Secondary | ICD-10-CM

## 2023-02-28 ENCOUNTER — Ambulatory Visit
Admission: RE | Admit: 2023-02-28 | Discharge: 2023-02-28 | Disposition: A | Discharge status: Home / Self Care | Payer: Medicare Other | Source: Ambulatory Visit | Attending: Pulmonary Disease | Admitting: Pulmonary Disease

## 2023-02-28 DIAGNOSIS — J849 Interstitial pulmonary disease, unspecified: Secondary | ICD-10-CM | POA: Insufficient documentation

## 2023-03-29 ENCOUNTER — Other Ambulatory Visit: Payer: Self-pay | Admitting: Pulmonary Disease

## 2023-03-29 DIAGNOSIS — Z7185 Encounter for immunization safety counseling: Secondary | ICD-10-CM

## 2023-03-29 DIAGNOSIS — R911 Solitary pulmonary nodule: Secondary | ICD-10-CM

## 2023-09-24 ENCOUNTER — Encounter: Payer: Self-pay | Admitting: *Deleted

## 2023-10-12 ENCOUNTER — Ambulatory Visit
Admission: RE | Admit: 2023-10-12 | Discharge: 2023-10-12 | Disposition: A | Discharge status: Home / Self Care | Attending: Gastroenterology | Admitting: Gastroenterology

## 2023-10-12 ENCOUNTER — Ambulatory Visit: Admitting: Anesthesiology

## 2023-10-12 ENCOUNTER — Encounter: Payer: Self-pay | Admitting: Gastroenterology

## 2023-10-12 ENCOUNTER — Encounter
Admission: RE | Disposition: A | Discharge status: Home / Self Care | Payer: Self-pay | Source: Home / Self Care | Attending: Gastroenterology

## 2023-10-12 DIAGNOSIS — K640 First degree hemorrhoids: Secondary | ICD-10-CM | POA: Diagnosis not present

## 2023-10-12 DIAGNOSIS — F17200 Nicotine dependence, unspecified, uncomplicated: Secondary | ICD-10-CM | POA: Diagnosis not present

## 2023-10-12 DIAGNOSIS — Z1211 Encounter for screening for malignant neoplasm of colon: Secondary | ICD-10-CM | POA: Diagnosis not present

## 2023-10-12 DIAGNOSIS — I10 Essential (primary) hypertension: Secondary | ICD-10-CM | POA: Insufficient documentation

## 2023-10-12 DIAGNOSIS — K5730 Diverticulosis of large intestine without perforation or abscess without bleeding: Secondary | ICD-10-CM | POA: Diagnosis not present

## 2023-10-12 DIAGNOSIS — J449 Chronic obstructive pulmonary disease, unspecified: Secondary | ICD-10-CM | POA: Diagnosis not present

## 2023-10-12 DIAGNOSIS — Z79899 Other long term (current) drug therapy: Secondary | ICD-10-CM | POA: Insufficient documentation

## 2023-10-12 DIAGNOSIS — Z860101 Personal history of adenomatous and serrated colon polyps: Secondary | ICD-10-CM | POA: Diagnosis present

## 2023-10-12 HISTORY — DX: Solitary pulmonary nodule: R91.1

## 2023-10-12 HISTORY — DX: Unspecified fracture of the lower end of unspecified radius, initial encounter for closed fracture: S52.509A

## 2023-10-12 HISTORY — DX: Third (oculomotor) nerve palsy, left eye: H49.02

## 2023-10-12 HISTORY — DX: Complete rotator cuff tear or rupture of unspecified shoulder, not specified as traumatic: M75.120

## 2023-10-12 HISTORY — PX: COLONOSCOPY: SHX5424

## 2023-10-12 HISTORY — DX: Essential (primary) hypertension: I10

## 2023-10-12 HISTORY — DX: Diplopia: H53.2

## 2023-10-12 HISTORY — DX: Other specified disorders of brain: G93.89

## 2023-10-12 SURGERY — COLONOSCOPY
Anesthesia: General

## 2023-10-12 MED ORDER — LIDOCAINE HCL (CARDIAC) PF 100 MG/5ML IV SOSY
PREFILLED_SYRINGE | INTRAVENOUS | Status: DC | PRN
  Administered 2023-10-12: 80 mg via INTRAVENOUS

## 2023-10-12 MED ORDER — PROPOFOL 500 MG/50ML IV EMUL
INTRAVENOUS | Status: DC | PRN
  Administered 2023-10-12: 75 ug/kg/min via INTRAVENOUS

## 2023-10-12 MED ORDER — GLYCOPYRROLATE 0.2 MG/ML IJ SOLN
INTRAMUSCULAR | Status: AC
  Filled 2023-10-12: qty 1

## 2023-10-12 MED ORDER — PROPOFOL 10 MG/ML IV BOLUS
INTRAVENOUS | Status: DC | PRN
  Administered 2023-10-12 (×2): 50 mg via INTRAVENOUS

## 2023-10-12 MED ORDER — DEXMEDETOMIDINE HCL IN NACL 80 MCG/20ML IV SOLN
INTRAVENOUS | Status: DC | PRN
Start: 2023-10-12 — End: 2023-10-12
  Administered 2023-10-12: 8 ug via INTRAVENOUS
  Administered 2023-10-12: 12 ug via INTRAVENOUS

## 2023-10-12 MED ORDER — GLYCOPYRROLATE 0.2 MG/ML IJ SOLN
INTRAMUSCULAR | Status: DC | PRN
  Administered 2023-10-12: .2 mg via INTRAVENOUS

## 2023-10-12 MED ORDER — SODIUM CHLORIDE 0.9 % IV SOLN
INTRAVENOUS | Status: DC
  Administered 2023-10-12: 20 mL/h via INTRAVENOUS

## 2023-10-12 NOTE — Transfer of Care (Signed)
 Immediate Anesthesia Transfer of Care Note  Patient: Chase Martin  Procedure(s) Performed: COLONOSCOPY  Patient Location: PACU  Anesthesia Type:General  Level of Consciousness: sedated  Airway & Oxygen Therapy: Patient Spontanous Breathing  Post-op Assessment: Report given to RN and Post -op Vital signs reviewed and stable  Post vital signs: Reviewed and stable  Last Vitals:  Vitals Value Taken Time  BP 87/59 10/12/23 11:30  Temp    Pulse 54 10/12/23 11:30  Resp 16 10/12/23 11:30  SpO2 97 % 10/12/23 11:30    Last Pain:  Vitals:   10/12/23 1130  TempSrc:   PainSc: Asleep         Complications: No notable events documented.

## 2023-10-12 NOTE — Anesthesia Preprocedure Evaluation (Signed)
 Anesthesia Evaluation  Patient identified by MRN, date of birth, ID band Patient awake    Reviewed: Allergy & Precautions, NPO status , Patient's Chart, lab work & pertinent test results  Airway Mallampati: III  TM Distance: >3 FB Neck ROM: full    Dental  (+) Upper Dentures, Lower Dentures   Pulmonary neg pulmonary ROS, COPD, Current Smoker   Pulmonary exam normal  + decreased breath sounds      Cardiovascular Exercise Tolerance: Good hypertension, Pt. on medications negative cardio ROS Normal cardiovascular exam Rhythm:Regular Rate:Normal     Neuro/Psych negative neurological ROS  negative psych ROS   GI/Hepatic negative GI ROS, Neg liver ROS,,,  Endo/Other  negative endocrine ROS    Renal/GU negative Renal ROS  negative genitourinary   Musculoskeletal   Abdominal Normal abdominal exam  (+)   Peds negative pediatric ROS (+)  Hematology negative hematology ROS (+)   Anesthesia Other Findings Past Medical History: No date: Brain parenchymal calcification No date: Closed fracture of distal end of radius No date: Diplopia No date: Full thickness rotator cuff tear No date: History of kidney stones No date: Hyperlipemia No date: Hypertension No date: Lung nodule 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  Past Surgical History: No date: COLONOSCOPY; N/A No date: prostatic surgery; N/A  BMI    Body Mass Index: 31.89 kg/m      Reproductive/Obstetrics negative OB ROS                              Anesthesia Physical Anesthesia Plan  ASA: 3  Anesthesia Plan: General   Post-op Pain Management:    Induction: Intravenous  PONV Risk Score and Plan: Propofol  infusion and TIVA  Airway Management Planned: Natural Airway and Nasal Cannula  Additional Equipment:   Intra-op Plan:   Post-operative Plan:   Informed Consent: I have reviewed the patients History and Physical,  chart, labs and discussed the procedure including the risks, benefits and alternatives for the proposed anesthesia with the patient or authorized representative who has indicated his/her understanding and acceptance.     Dental Advisory Given  Plan Discussed with: CRNA  Anesthesia Plan Comments:         Anesthesia Quick Evaluation

## 2023-10-12 NOTE — Op Note (Signed)
 [facility] Patient Name: Chase Martin Procedure Date: 10/12/2023 10:57 AM MRN: [id number] Account #: [id number] Date of Birth: 06-15-1950 Admit Type: Outpatient Age: 73 Room: [facility] ENDO ROOM 3 Gender: Male Note Status: Finalized Instrument Name: Colon Scope [id number] Procedure:             Colonoscopy Indications:           High risk colon cancer surveillance: Personal history                         of colonic polyps, Surveillance: Personal history of                         adenomatous polyps on last colonoscopy > 5 years ago Providers:             Ole Schick MD, MD Referring MD:          [name], MD (Referring MD) Medicines:             Monitored Anesthesia Care Complications:         No immediate complications. Procedure:             Pre-Anesthesia Assessment:                        - Prior to the procedure, a History and Physical was                         performed, and patient medications and allergies were                         reviewed. The patient is competent. The risks and                         benefits of the procedure and the sedation options and                         risks were discussed with the patient. All questions                         were answered and informed consent was obtained.                         Patient identification and proposed procedure were                         verified by the physician, the nurse, the                         anesthesiologist, the anesthetist and the technician                         in the endoscopy suite. Mental Status Examination:                         alert and oriented. Airway Examination: normal                         oropharyngeal airway and neck mobility. Respiratory  Examination: clear to auscultation. CV Examination:                         normal. Prophylactic Antibiotics: The patient does not                         require  prophylactic antibiotics. Prior                         Anticoagulants: The patient has taken no anticoagulant                         or antiplatelet agents. ASA Grade Assessment: III - A                         patient with severe systemic disease. After reviewing                         the risks and benefits, the patient was deemed in                         satisfactory condition to undergo the procedure. The                         anesthesia plan was to use monitored anesthesia care                         (MAC). Immediately prior to administration of                         medications, the patient was re-assessed for adequacy                         to receive sedatives. The heart rate, respiratory                         rate, oxygen saturations, blood pressure, adequacy of                         pulmonary ventilation, and response to care were                         monitored throughout the procedure. The physical                         status of the patient was re-assessed after the                         procedure.                        After obtaining informed consent, the colonoscope was                         passed under direct vision. Throughout the procedure,                         the patient's blood pressure, pulse, and oxygen  saturations were monitored continuously. The                         Colonoscope was introduced through the anus and                         advanced to the the terminal ileum, with                         identification of the appendiceal orifice and IC                         valve. The colonoscopy was performed without                         difficulty. The patient tolerated the procedure well.                         The quality of the bowel preparation was good. The                         ileocecal valve, appendiceal orifice, and rectum were                         photographed. Findings:      The perianal  and digital rectal examinations were normal.      The terminal ileum appeared normal.      Multiple small-mouthed diverticula were found in the sigmoid colon,       descending colon, splenic flexure, transverse colon, hepatic flexure,       ascending colon and cecum.      Internal hemorrhoids were found during retroflexion. The hemorrhoids       were Grade I (internal hemorrhoids that do not prolapse).      The exam was otherwise without abnormality on direct and retroflexion       views. Impression:            - The examined portion of the ileum was normal.                        - Diverticulosis in the sigmoid colon, in the                         descending colon, at the splenic flexure, in the                         transverse colon, at the hepatic flexure, in the                         ascending colon and in the cecum.                        - Internal hemorrhoids.                        - The examination was otherwise normal on direct and                         retroflexion views.                        -  No specimens collected. Recommendation:        - Discharge patient to home.                        - Resume previous diet.                        - Continue present medications.                        - Repeat colonoscopy is not recommended due to current                         age (53 years or older) for surveillance.                        - Return to referring physician as previously                         scheduled. Procedure Code(s):     --- Professional ---                        H9894, Colorectal cancer screening; colonoscopy on                         individual at high risk Diagnosis Code(s):     --- Professional ---                        K64.0, First degree hemorrhoids                        Z86.010, Personal history of colonic polyps                        K57.30, Diverticulosis of large intestine without                         perforation or abscess without  bleeding CPT copyright 2022 American [employer]. All rights reserved. The codes documented in this report are preliminary and upon coder review may  be revised to meet current compliance requirements. Ole Schick MD, MD 10/12/2023 11:33:51 AM Number of Addenda: 0 Note Initiated On: 10/12/2023 10:57 AM Scope Withdrawal Time: 0 hours 8 minutes 8 seconds  Total Procedure Duration: 0 hours 10 minutes 10 seconds  Estimated Blood Loss:  Estimated blood loss: none.      [facility]

## 2023-10-12 NOTE — Anesthesia Postprocedure Evaluation (Signed)
 Anesthesia Post Note  Patient: Daxtin Leiker  Procedure(s) Performed: COLONOSCOPY  Patient location during evaluation: PACU Anesthesia Type: General Level of consciousness: awake Pain management: pain level controlled Respiratory status: nonlabored ventilation Cardiovascular status: stable Anesthetic complications: no   No notable events documented.   Last Vitals:  Vitals:   10/12/23 1140 10/12/23 1150  BP: (!) 92/58 99/67  Pulse: (!) 49 (!) 48  Resp: 15 18  Temp:    SpO2: 96% 96%    Last Pain:  Vitals:   10/12/23 1150  TempSrc:   PainSc: 0-No pain                 VAN STAVEREN,Coralynn Gaona

## 2023-10-12 NOTE — H&P (Signed)
 Outpatient short stay form Pre-procedure 10/12/2023  Ole ONEIDA Schick, MD  Primary Physician: Auston Reyes BIRCH, MD  Reason for visit:  Surveillance  History of present illness:    73 y/o gentleman with history of polyps. Last colonoscopy in 2018 was in [place], we don't have the results of this procedure. No blood thinners. History of possible cholecystectomy. No family history of GI malignancies.    Current Facility-Administered Medications:    0.9 %  sodium chloride  infusion, , Intravenous, Continuous, Sadaf Przybysz, Ole ONEIDA, MD  Medications Prior to Admission  Medication Sig Dispense Refill Last Dose/Taking   ASPIRIN LOW DOSE 81 MG EC tablet TK 1 T PO ONCE D  7 10/11/2023   atorvastatin (LIPITOR) 10 MG tablet TK 1 T PO ONCE D  7 10/11/2023   ibuprofen  (MOTRIN  IB) 200 MG tablet Take 3 tablets (600 mg total) by mouth every 6 (six) hours as needed. 20 tablet 0 10/11/2023   ondansetron  (ZOFRAN ) 4 MG tablet Take 1 tablet (4 mg total) by mouth every 8 (eight) hours as needed for nausea or vomiting. 8 tablet 0 10/11/2023   tamsulosin  (FLOMAX ) 0.4 MG CAPS capsule Take 1 capsule (0.4 mg total) by mouth daily. 10 capsule 0 10/11/2023     No Known Allergies   Past Medical History:  Diagnosis Date   Brain parenchymal calcification    Closed fracture of distal end of radius    Diplopia    Full thickness rotator cuff tear    History of kidney stones    Hyperlipemia    Hypertension    Lung nodule    Partial left third nerve palsy     Review of systems:  Otherwise negative.    Physical Exam  Gen: Alert, oriented. Appears stated age.  HEENT: PERRLA. Lungs: No respiratory distress CV: RRR Abd: soft, benign, no masses Ext: No edema    Planned procedures: Proceed with colonoscopy. The patient understands the nature of the planned procedure, indications, risks, alternatives and potential complications including but not limited to bleeding, infection, perforation, damage to internal  organs and possible oversedation/side effects from anesthesia. The patient agrees and gives consent to proceed.  Please refer to procedure notes for findings, recommendations and patient disposition/instructions.     Ole ONEIDA Schick, MD [facility]

## 2023-10-12 NOTE — Interval H&P Note (Signed)
 History and Physical Interval Note:  10/12/2023 11:08 AM  Chase Martin  has presented today for surgery, with the diagnosis of History of adenomatous polyp of colon (Z86.0101).  The various methods of treatment have been discussed with the patient and family. After consideration of risks, benefits and other options for treatment, the patient has consented to  Procedure(s) with comments: COLONOSCOPY (N/A) - SPANISH INTERPRETER as a surgical intervention.  The patient's history has been reviewed, patient examined, no change in status, stable for surgery.  I have reviewed the patient's chart and labs.  Questions were answered to the patient's satisfaction.     Ole ONEIDA Schick  Ok to proceed with colonoscopy

## 2024-02-08 ENCOUNTER — Other Ambulatory Visit: Payer: Self-pay | Admitting: Pulmonary Disease

## 2024-02-08 DIAGNOSIS — Z7185 Encounter for immunization safety counseling: Secondary | ICD-10-CM

## 2024-02-08 DIAGNOSIS — R911 Solitary pulmonary nodule: Secondary | ICD-10-CM

## 2024-04-30 IMAGING — CT CT CHEST W/ CM
2 of 4 series · 14 of 36 positions shown, 17 images · IV contrast (agent unspecified)
Comparison: Dictated report from the chest radiograph, 05/20/2021,
as well as older studies.

CLINICAL DATA: Follow-up from recent chest radiograph, which
demonstrated a 1.6 cm right perihilar nodule.

EXAM:
CT CHEST WITH CONTRAST
TECHNIQUE: Multidetector CT imaging of the chest was performed during
intravenous contrast administration.

[Series 2: axial chest 2.00 · axial · 0.62mm/px · z∈[-1170,-934]mm · 11 of 140 slices shown, 14 images]
[im 11/140  mediastinal]
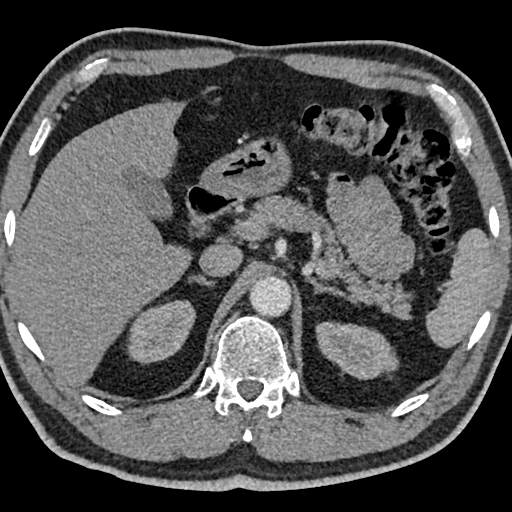
[im 11/140  lung]
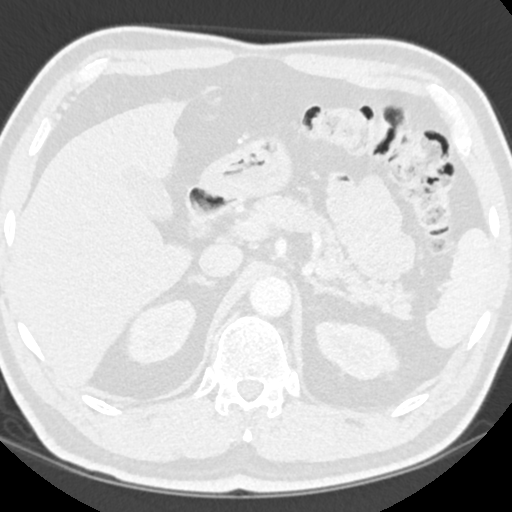
[im 22/140  lung]
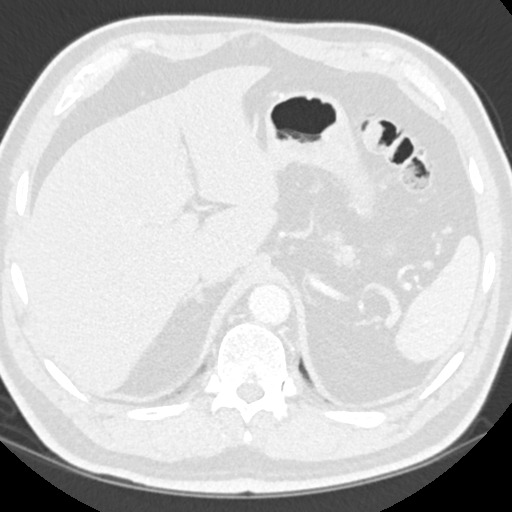
[im 33/140  lung]
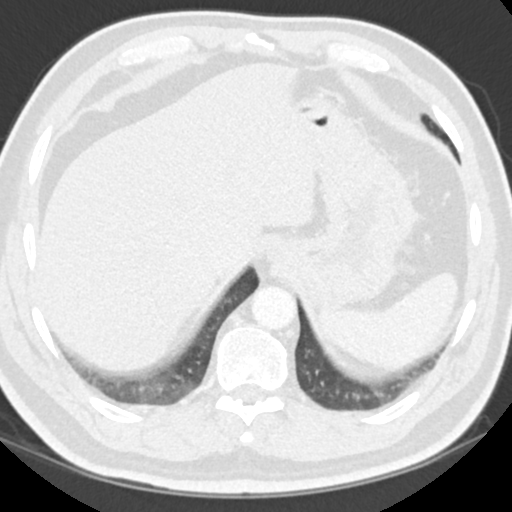
[im 43/140  lung]
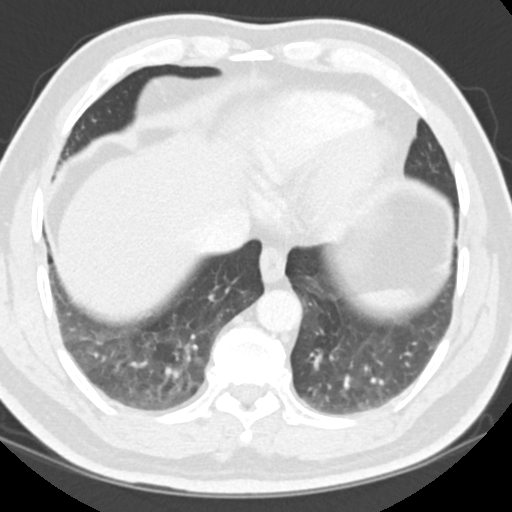
[im 54/140  mediastinal]
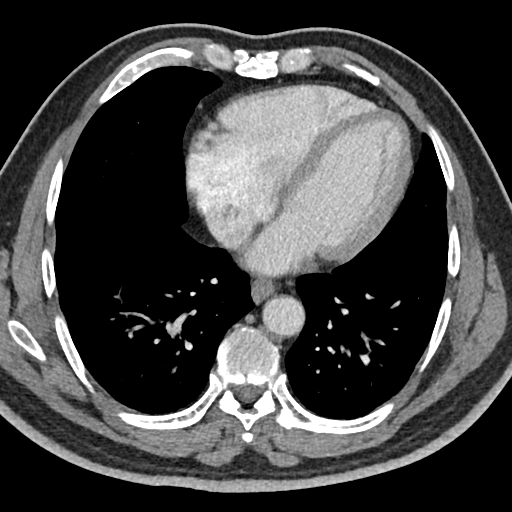
[im 54/140  lung]
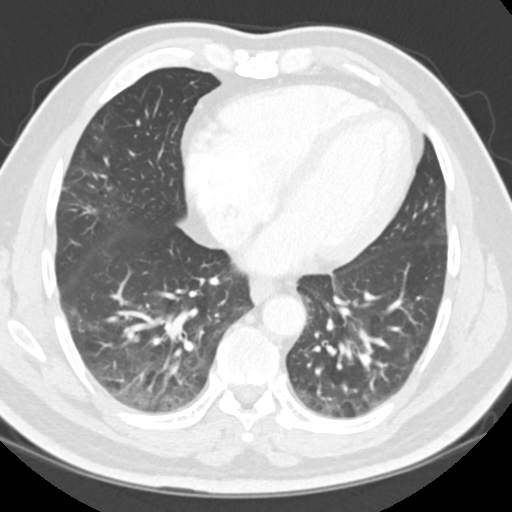
[im 75/140  lung]
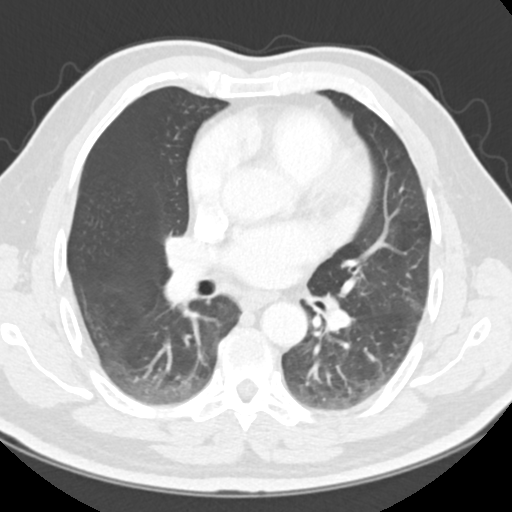
[im 86/140  lung]
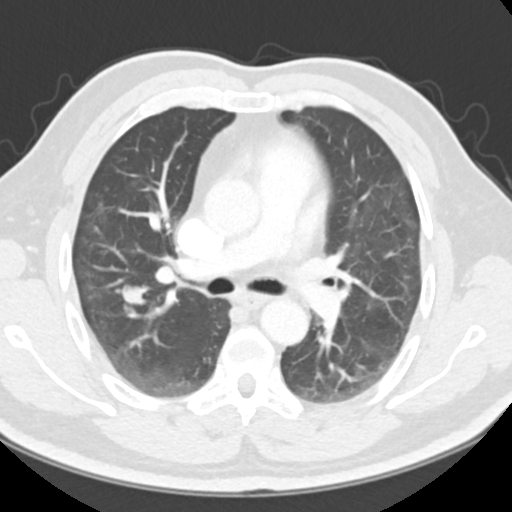
[im 97/140  lung]
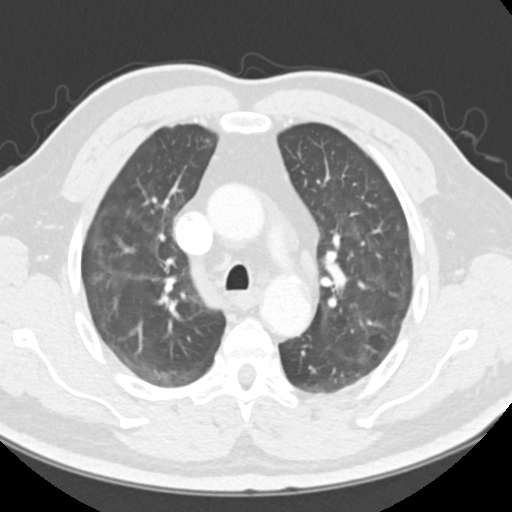
[im 107/140  mediastinal]
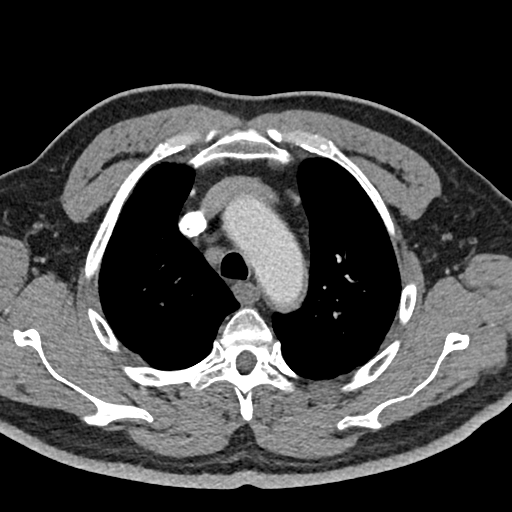
[im 107/140  lung]
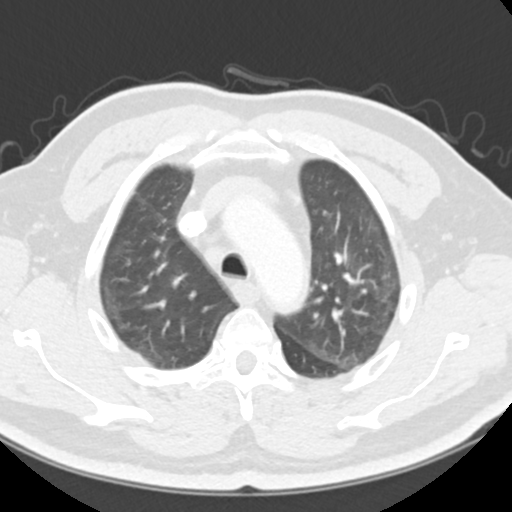
[im 118/140  lung]
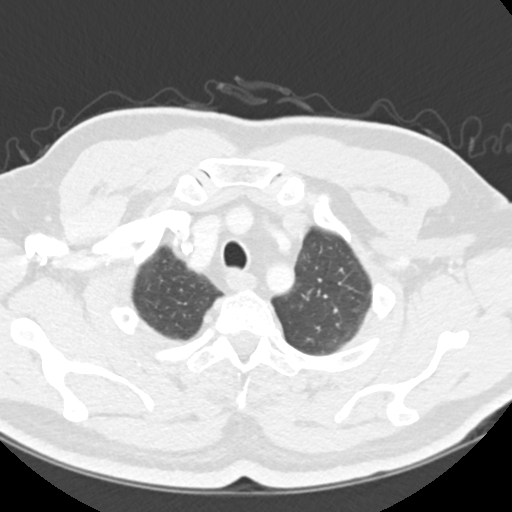
[im 129/140  lung]
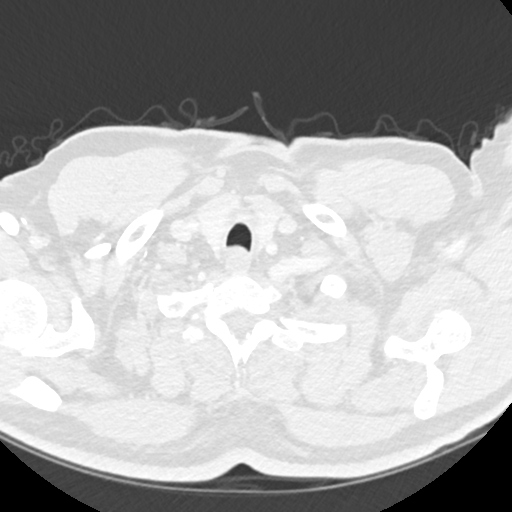

[Series 4: coronal chest 2.00 cor · coronal · 0.55mm/px · 3 of 150 slices shown]
[im 30/150  lung]
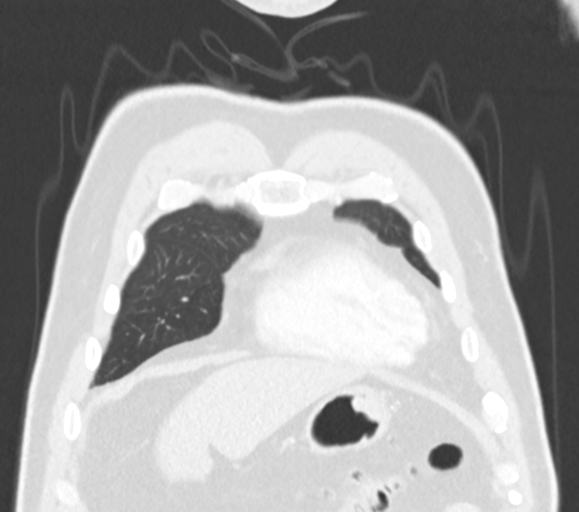
[im 60/150  lung]
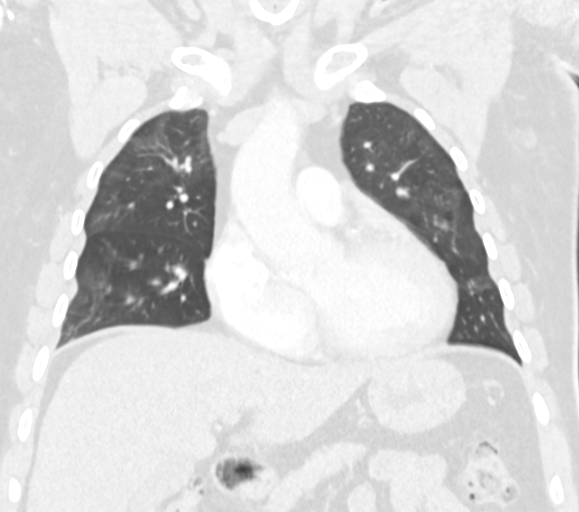
[im 90/150  lung]
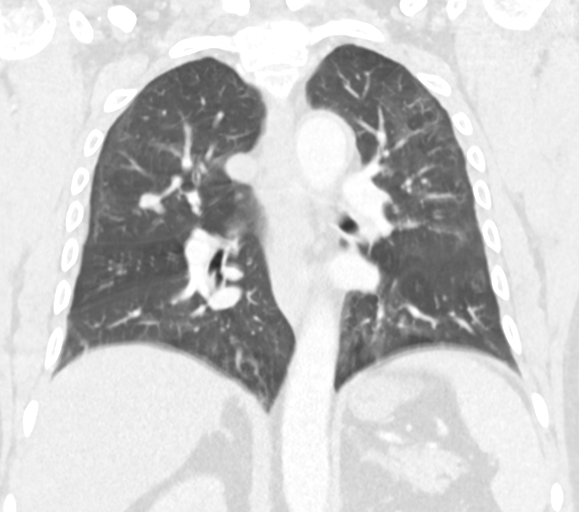

[14 of 36 positions shown; findings below may reference images not displayed]

RADIATION DOSE REDUCTION: This exam was performed according to the
departmental dose-optimization program which includes automated
exposure control, adjustment of the mA and/or kV according to
patient size and/or use of iterative reconstruction technique.

CONTRAST:  75mL OMNIPAQUE IOHEXOL 300 MG/ML  SOLN
FINDINGS: Cardiovascular: Heart mildly enlarged. No pericardial effusion.
Great vessels normal in caliber. Mild, mostly noncalcified,
atherosclerotic plaque along the distal aortic arch. No dissection.
Arch branch vessels are widely patent.

Mediastinum/Nodes: Normal thyroid. Prominent right paratracheal
lymph node, 1 cm in short axis. No other prominent mediastinal lymph
nodes. No mediastinal or hilar masses. No hilar adenopathy. Normal
trachea and esophagus.

Lungs/Pleura: There are 2 adjacent nodules, in the right upper lobe
posteriorly and inferiorly, larger centered on image 56, series 3,
measuring 1.3 x 0.8 x 0.9 cm. The smaller, which lies just posterior
to the larger nodule, appears contiguous with the larger nodule and
measures approximately 7 mm.

Third nodule, mean 4 mm, lies inferior to the above contiguous
nodules, also in the posteroinferior right upper lobe, image 63,
series 3. No other nodules.

Patchy bilateral ground-glass opacities are noted, which were not
present at the lung bases on the prior abdomen and pelvis CT from
08/05/2017.

No pleural effusion.  No pneumothorax.

Upper Abdomen: No acute findings.

Musculoskeletal: No fracture or acute finding.  No bone lesion.
IMPRESSION: 1. Two contiguous nodules in the right upper lobe correspond to the
nodule noted on the recent chest radiograph. These are measured
separately as detailed above, largest 1.3 cm. The combined nodules
span 2 cm in greatest dimension. There is also a third adjacent
nodule in the posteroinferior right upper lobe measuring 4 mm.
Non-contrast chest CT at 3-6 months is recommended. If the nodules
are stable at time of repeat CT, then future CT at 18-24 months
(from today's scan) is considered optional for low-risk patients,
but is recommended for high-risk patients. This recommendation
follows the consensus statement: Guidelines for Management of
Incidental Pulmonary Nodules Detected on CT Images: From the
recommendation is for incidental pulmonary nodules, multiple, 6 mm
or greater. However, since the radiographic finding was new, tissue
sampling and/or PET-CT should be considered.
2. Bilateral patchy areas of ground-glass opacity, nonspecific.
Consider multifocal infection or inflammation in the differential
diagnosis. This also warrants follow-up CT to assess for progression
versus improvement, with repeat noncontrast chest CT in 3-6 months.

Aortic Atherosclerosis ([id number]).
